# Patient Record
Sex: Female | Born: 1990 | Race: Black or African American | Hispanic: No | Marital: Single | Smoking: Never smoker
Health system: Southern US, Community
[De-identification: ages and names within clinical notes are randomized; demographics above are authoritative.]

## PROBLEM LIST (undated history)

## (undated) DIAGNOSIS — L732 Hidradenitis suppurativa: Secondary | ICD-10-CM

---

## 2016-11-09 HISTORY — PX: ANKLE FRACTURE SURGERY: SHX122

## 2017-07-06 ENCOUNTER — Emergency Department (HOSPITAL_BASED_OUTPATIENT_CLINIC_OR_DEPARTMENT_OTHER)
Admission: EM | Admit: 2017-07-06 | Discharge: 2017-07-06 | Disposition: A | Discharge status: Home / Self Care | Payer: Self-pay | Attending: Emergency Medicine | Admitting: Emergency Medicine

## 2017-07-06 ENCOUNTER — Encounter (HOSPITAL_BASED_OUTPATIENT_CLINIC_OR_DEPARTMENT_OTHER): Payer: Self-pay | Admitting: *Deleted

## 2017-07-06 DIAGNOSIS — L0291 Cutaneous abscess, unspecified: Secondary | ICD-10-CM

## 2017-07-06 DIAGNOSIS — L02412 Cutaneous abscess of left axilla: Secondary | ICD-10-CM | POA: Insufficient documentation

## 2017-07-06 LAB — CBC WITH DIFFERENTIAL/PLATELET
BASOS ABS: 0 10*3/uL (ref 0.0–0.1)
BASOS PCT: 0 %
EOS ABS: 0.3 10*3/uL (ref 0.0–0.7)
EOS PCT: 3 %
HEMATOCRIT: 36 % (ref 36.0–46.0)
Hemoglobin: 11.9 g/dL — ABNORMAL LOW (ref 12.0–15.0)
Lymphocytes Relative: 38 %
Lymphs Abs: 3.6 10*3/uL (ref 0.7–4.0)
MCH: 28.9 pg (ref 26.0–34.0)
MCHC: 33.1 g/dL (ref 30.0–36.0)
MCV: 87.4 fL (ref 78.0–100.0)
MONO ABS: 0.7 10*3/uL (ref 0.1–1.0)
MONOS PCT: 8 %
Neutro Abs: 4.8 10*3/uL (ref 1.7–7.7)
Neutrophils Relative %: 51 %
PLATELETS: 363 10*3/uL (ref 150–400)
RBC: 4.12 MIL/uL (ref 3.87–5.11)
RDW: 13.9 % (ref 11.5–15.5)
WBC: 9.4 10*3/uL (ref 4.0–10.5)

## 2017-07-06 LAB — BASIC METABOLIC PANEL
Anion gap: 6 (ref 5–15)
BUN: 12 mg/dL (ref 6–20)
CO2: 25 mmol/L (ref 22–32)
CREATININE: 0.47 mg/dL (ref 0.44–1.00)
Calcium: 8.9 mg/dL (ref 8.9–10.3)
Chloride: 107 mmol/L (ref 101–111)
Glucose, Bld: 94 mg/dL (ref 65–99)
Potassium: 3.5 mmol/L (ref 3.5–5.1)
SODIUM: 138 mmol/L (ref 135–145)

## 2017-07-06 LAB — I-STAT CG4 LACTIC ACID, ED: Lactic Acid, Venous: 0.54 mmol/L (ref 0.5–1.9)

## 2017-07-06 MED ORDER — ONDANSETRON HCL 4 MG/2ML IJ SOLN
4.0000 mg | Freq: Once | INTRAMUSCULAR | Status: AC
  Administered 2017-07-06: 4 mg via INTRAVENOUS
  Filled 2017-07-06: qty 2

## 2017-07-06 MED ORDER — CLINDAMYCIN HCL 150 MG PO CAPS: 300.0000 mg | ORAL_CAPSULE | Freq: Two times a day (BID) | ORAL | 0 refills | Status: AC

## 2017-07-06 MED ORDER — ACETAMINOPHEN 500 MG PO TABS
1000.0000 mg | ORAL_TABLET | Freq: Once | ORAL | Status: AC
  Administered 2017-07-06: 1000 mg via ORAL
  Filled 2017-07-06: qty 2

## 2017-07-06 MED ORDER — MORPHINE SULFATE (PF) 4 MG/ML IV SOLN
4.0000 mg | Freq: Once | INTRAVENOUS | Status: AC
  Administered 2017-07-06: 4 mg via INTRAVENOUS
  Filled 2017-07-06: qty 1

## 2017-07-06 MED ORDER — CLINDAMYCIN PHOSPHATE 1 % EX GEL: Freq: Two times a day (BID) | CUTANEOUS | 0 refills | Status: DC

## 2017-07-06 NOTE — ED Notes (Signed)
Pt directed to pharmacy to pick up RX. Pt has a ride. Ambulatory to d/c window with steady gait.

## 2017-07-06 NOTE — ED Provider Notes (Signed)
[facility]   CSN: [id number] Arrival date & time: 07/06/17  1043     History   Chief Complaint Chief Complaint  Patient presents with  . Abscess    HPI Marcia Durham is a 26 y.o. female presents to the ED for evaluation of multiple boils to bilateral axilla 2 months. Associated with swelling, redness, tenderness and yellow/green discharge.  Also states that she has smaller and self resolving boils and inguinal regions. Has taken ibuprofen and Tylenol for pain with minimal relief. Aggravated by palpation and movement of her arms. Denies fevers, chills, night sweats, myalgias. Denies previous history of the same. She is adopted and does not know of family medical history.  HPI  History reviewed. No pertinent past medical history.  There are no active problems to display for this patient.   History reviewed. No pertinent surgical history.  OB History    No data available       Home Medications    Prior to Admission medications   Medication Sig Start Date End Date Taking? Authorizing Provider  clindamycin (CLEOCIN) 150 MG capsule Take 2 capsules (300 mg total) by mouth 2 (two) times daily. 07/06/17 07/13/17  Liberty Handy, PA-C  clindamycin (CLINDAGEL) 1 % gel Apply topically 2 (two) times daily. 07/06/17   Liberty Handy, PA-C    Family History No family history on file.  Social History Social History  Substance Use Topics  . Smoking status: Never Smoker  . Smokeless tobacco: Never Used  . Alcohol use No     Allergies   Patient has no known allergies.   Review of Systems Review of Systems  Constitutional: Negative for chills, diaphoresis and fever.  Respiratory: Negative for cough.   Cardiovascular: Negative for chest pain.  Gastrointestinal: Negative for abdominal pain, nausea and vomiting.  Genitourinary: Negative for difficulty urinating and dysuria.  Skin: Positive for color change and wound.       +boils      Physical Exam Updated Vital Signs BP 124/79 (BP Location: Left Arm)   Pulse 83   Temp 98.1 F (36.7 C) (Oral)   Resp 18   Ht 5\' 6"  (1.676 m)   Wt 95.3 kg (210 lb)   LMP 06/20/2017   SpO2 99%   BMI 33.89 kg/m   Physical Exam  Constitutional: She is oriented to person, place, and time. She appears well-developed and well-nourished. No distress.  HENT:  Head: Normocephalic and atraumatic.  Nose: Nose normal.  Mouth/Throat: Oropharynx is clear and moist. No oropharyngeal exudate.  Eyes: Pupils are equal, round, and reactive to light. Conjunctivae and EOM are normal.  Neck: Normal range of motion. Neck supple.  Cardiovascular: Normal rate, regular rhythm, normal heart sounds and intact distal pulses.   No murmur heard. Pulmonary/Chest: Effort normal and breath sounds normal. No respiratory distress. She has no wheezes. She has no rales. She exhibits no tenderness.  Abdominal: Soft. Bowel sounds are normal. She exhibits no distension and no mass. There is no tenderness. There is no rebound and no guarding.  Musculoskeletal: Normal range of motion. She exhibits no deformity.  Lymphadenopathy:    She has no cervical adenopathy.  Neurological: She is alert and oriented to person, place, and time. No sensory deficit.  Skin: Skin is warm and dry. Capillary refill takes less than 2 seconds. There is erythema.  Multiple draining inflamed nodules to bilateral axilla with sinus tracts mild erythema, edema and tenderness localized to axilla without streaking  Skin in inguinal region normal   Psychiatric: She has a normal mood and affect. Her behavior is normal. Judgment and thought content normal.  Nursing note and vitals reviewed.    ED Treatments / Results  Labs (all labs ordered are listed, but only abnormal results are displayed) Labs Reviewed  CBC WITH DIFFERENTIAL/PLATELET - Abnormal; Notable for the following:       Result Value   Hemoglobin 11.9 (*)    All other components  within normal limits  BASIC METABOLIC PANEL  I-STAT CG4 LACTIC ACID, ED  I-STAT CG4 LACTIC ACID, ED    EKG  EKG Interpretation None       Radiology No results found.  Procedures Procedures (including critical care time)  Medications Ordered in ED Medications  morphine 4 MG/ML injection 4 mg (4 mg Intravenous Given 07/06/17 1401)  ondansetron (ZOFRAN) injection 4 mg (4 mg Intravenous Given 07/06/17 1359)  acetaminophen (TYLENOL) tablet 1,000 mg (1,000 mg Oral Given 07/06/17 1326)     Initial Impression / Assessment and Plan / ED Course  I have reviewed the triage vital signs and the nursing notes.  Pertinent labs & imaging results that were available during my care of the patient were reviewed by me and considered in my medical decision making (see chart for details).   26 year old female with no medical history presents to ED for evaluation of multiple abscesses to bilateral axilla with localized erythema, edema and tenderness. Actively draining. Also reports occasional boils to her inguinal region, none today. No indication for formal incision and drainage today given recurrence and multiple nodules in sinus tracts. Offered patient cleansing and pressure to relieve some of the pressure however patient opted to do this herself as she is afraid that it will hurt. No fevers, chills, sweats. Lab work today within normal limits. Lactic acid normal. No leukocytosis. Suspect hidradenitis. We'll discharge with high-dose NSAIDs, oral antibiotics and topical antibiotics. Given contact information for PCP and surgery. Discussed signs and symptoms that would warrant return to the ED.   Final Clinical Impressions(s) / ED Diagnoses   Final diagnoses:  Abscess    New Prescriptions New Prescriptions   CLINDAMYCIN (CLEOCIN) 150 MG CAPSULE    Take 2 capsules (300 mg total) by mouth 2 (two) times daily.   CLINDAMYCIN (CLINDAGEL) 1 % GEL    Apply topically 2 (two) times daily.     Liberty Handy, PA-C 07/06/17 1427    Charlynne Pander, MD 07/06/17 [phone]

## 2017-07-06 NOTE — ED Notes (Signed)
Pt given d/c instructions per PA Gibbons to review. She will be in to talk to pt before sending her home

## 2017-07-06 NOTE — Discharge Instructions (Signed)
You were evaluated in the ED for multiple abscesses to your armpits. Your blood work showed no generalized infection in your body. Given the recurrence of your abscesses in your armpits and your groin I am concerned you may have hidradenitis suppurativa. Please repeat the attached information on this condition. This condition typically requires medicines and sometimes surgery. Please establish care with a primary care physician to help manage this. Additionally call surgery clinic to establish care as this sometimes needs surgical intervention.  Take oral antibiotic as prescribed. Additionally, use topical antibiotic cream to help with flares. Take ibuprofen and tylenol for inflammation and pain. Avoid contaminating this area and picking at your skin with dirty hands.  Return to the ED if you develop worsening swelling, pain, discharge, fevers, chills, body aches.

## 2017-07-06 NOTE — ED Notes (Signed)
Pt has a ride at bedside

## 2017-07-06 NOTE — ED Notes (Signed)
PA at bedside.

## 2017-07-06 NOTE — ED Triage Notes (Signed)
Abscess to both axilla. Draining and pain.

## 2017-11-06 ENCOUNTER — Emergency Department (HOSPITAL_BASED_OUTPATIENT_CLINIC_OR_DEPARTMENT_OTHER)
Admission: EM | Admit: 2017-11-06 | Discharge: 2017-11-06 | Disposition: A | Discharge status: Home / Self Care | Payer: Self-pay | Attending: Emergency Medicine | Admitting: Emergency Medicine

## 2017-11-06 ENCOUNTER — Encounter (HOSPITAL_BASED_OUTPATIENT_CLINIC_OR_DEPARTMENT_OTHER): Payer: Self-pay | Admitting: Emergency Medicine

## 2017-11-06 ENCOUNTER — Other Ambulatory Visit: Payer: Self-pay

## 2017-11-06 DIAGNOSIS — L748 Other eccrine sweat disorders: Secondary | ICD-10-CM

## 2017-11-06 DIAGNOSIS — L02411 Cutaneous abscess of right axilla: Secondary | ICD-10-CM | POA: Insufficient documentation

## 2017-11-06 DIAGNOSIS — L02412 Cutaneous abscess of left axilla: Secondary | ICD-10-CM | POA: Insufficient documentation

## 2017-11-06 MED ORDER — TRAMADOL HCL 50 MG PO TABS: 50.0000 mg | ORAL_TABLET | Freq: Four times a day (QID) | ORAL | 0 refills | Status: AC | PRN

## 2017-11-06 MED ORDER — TRAMADOL HCL 50 MG PO TABS
50.0000 mg | ORAL_TABLET | Freq: Once | ORAL | Status: AC
  Administered 2017-11-06: 50 mg via ORAL
  Filled 2017-11-06: qty 1

## 2017-11-06 MED ORDER — LIDOCAINE HCL 2 % IJ SOLN
20.0000 mL | Freq: Once | INTRAMUSCULAR | Status: AC
  Administered 2017-11-06: 400 mg

## 2017-11-06 MED ORDER — NAPROXEN 500 MG PO TABS: 500.0000 mg | ORAL_TABLET | Freq: Two times a day (BID) | ORAL | 0 refills | Status: AC

## 2017-11-06 MED ORDER — NAPROXEN 250 MG PO TABS
500.0000 mg | ORAL_TABLET | Freq: Once | ORAL | Status: AC
  Administered 2017-11-06: 500 mg via ORAL
  Filled 2017-11-06: qty 2

## 2017-11-06 MED ORDER — LIDOCAINE HCL 2 % IJ SOLN
INTRAMUSCULAR | Status: AC
  Filled 2017-11-06: qty 20

## 2017-11-06 NOTE — ED Notes (Signed)
ED Provider at bedside. 

## 2017-11-06 NOTE — ED Triage Notes (Signed)
Pt c/o abscess to B/L axilla area ongoing "for a while." Pt reports areas are draining brown/yellow colored fluid.

## 2017-11-06 NOTE — Discharge Instructions (Addendum)
You were seen in the emergency department for multiple abscesses in your arm pit area. We have prescribed you Naproxen and tramadol for pain:  - Naproxen is a non steroidal anti-inflammatory medication.  Take this once every 12 hours as needed.  This should help with pain and swelling.  I suggest that you take this with food as it can cause stomach upset, and at worst stomach bleeding.  Do not take other nonsteroidal anti-inflammatory medications while taking naproxen, this includes Advil, Motrin, or Aleve as this will further irritate your stomach.  - Tramadol-This is a controlled substance pain medication.  You may take this every 6 hours as needed for pain.  Do not drive or operate heavy machinery while taking this medication.  Continue to apply warm compresses to your underarm area.  I suspect you to have hidradenitis suppurativa please see the attached handout for further information regarding this condition.  I provided several dermatology offices contact information, please call and try to make an appointment with 1 of these offices within the next 1 week. I have also provided a few primary care provider offices in the area, you may also try to make an appointment with 1 of these offices.  Return to the emergency department for any new or worsening symptoms.  [facility]:   [facility]  3.2 [phone]  Dermatologist  [address] [id number]  [phone]   Dr. Mertha FindersJohn H. Hall Jr, MD  2.6 [phone])  Dermatologist  [address]  [phone]  [facility]  3.5 (3)  [facility]  [address]  [phone]   [facility]  4.0 (4)  Dermatologist  [address]  [phone]) [phone]  Janalyn Harderafeen Stuart MD  3.0 (2)  Dermatologist  [address]  [phone]  Hoyle SauerMccoy Bruce  2.7 (6)  Dermatologist  [address]  [phone]  [place] Amy Y MD  2.0 (1)  Dermatologist  [address]  [phone]  [facility]  5.0 (3)  Doctor  [address]  [phone]

## 2017-11-06 NOTE — ED Provider Notes (Signed)
[facility] EMERGENCY DEPARTMENT Provider Note   CSN: [id number] Arrival date & time: 11/06/17  1314   History   Chief Complaint Chief Complaint  Patient presents with  . Abscess    HPI Marcia Durham is a 26 y.o. female who presents the emergency department for acute on chronic axillary abscesses that have been worse over the past 1 week.  Patient states she has had problems with abscesses in her axillary region for a few years now with intermittent flares.  Patient states that these abscesses seem worse because they are more painful over the past 1 week.  Patient states she has been taking over-the-counter Tylenol and ibuprofen without significant relief.  She also been applying warm compresses.  States she has had active drainage from both axillary regions that is green/yellow in color, at times blood streaked.  No significant alleviating or aggravating factors.  Patient denies fever, chills, numbness, weakness, nausea, or vomiting.  HPI  History reviewed. No pertinent past medical history.  There are no active problems to display for this patient.   Past Surgical History:  Procedure Laterality Date  . ANKLE FRACTURE SURGERY      OB History    No data available       Home Medications    Prior to Admission medications   Medication Sig Start Date End Date Taking? Authorizing Provider  ibuprofen (ADVIL,MOTRIN) 200 MG tablet Take 200 mg by mouth every 6 (six) hours as needed.   Yes [provider]  naproxen (NAPROSYN) 500 MG tablet Take 1 tablet (500 mg total) by mouth 2 (two) times daily. 11/06/17   Nakyla Bracco R, PA-C  traMADol (ULTRAM) 50 MG tablet Take 1 tablet (50 mg total) by mouth every 6 (six) hours as needed. 11/06/17   Shineka Auble, Pleas KochSamantha R, PA-C    Family History History reviewed. No pertinent family history.  Social History Social History   Tobacco Use  . Smoking status: Never Smoker  . Smokeless tobacco: Never Used    Substance Use Topics  . Alcohol use: No  . Drug use: No     Allergies   Patient has no known allergies.   Review of Systems Review of Systems  Constitutional: Negative for chills and fever.  Respiratory: Negative for shortness of breath.   Gastrointestinal: Negative for abdominal distention, abdominal pain, nausea and vomiting.  Skin:       Multiple abscess to bilateral axillary regions  Neurological: Negative for weakness and numbness.     Physical Exam Updated Vital Signs BP 131/90 (BP Location: Left Arm)   Pulse (!) 102   Temp 98.2 F (36.8 C) (Oral)   Resp 20   LMP 10/23/2017   SpO2 98%   Physical Exam  Constitutional: She appears well-developed and well-nourished.  Non-toxic appearance. She appears distressed (mild secondary to pain).  HENT:  Head: Normocephalic and atraumatic.  Eyes: Conjunctivae are normal. Right eye exhibits no discharge. Left eye exhibits no discharge.  Neck: Neck supple.  Cardiovascular: Normal rate and regular rhythm.  No murmur heard. Pulses:      Radial pulses are 2+ on the right side, and 2+ on the left side.  Pulmonary/Chest: Effort normal and breath sounds normal. She has no wheezes. She has no rales.  Abdominal: Soft. She exhibits no distension. There is no tenderness.  Neurological: She is alert.  Clear speech. 5/5 grip strength bilaterally. Sensation grossly intact to bilateral upper extremities.   Skin:  Patient with induration throughout bilateral axillary  regions.  Right axilla with 2 actively draining areas with underlying minimal fluctuance.  Left axilla with 3 actively draining areas with minimal fluctuance. No overlying erythema or warmth.   Psychiatric: She has a normal mood and affect. Her behavior is normal. Thought content normal.  Nursing note and vitals reviewed.   ED Treatments / Results  Labs (all labs ordered are listed, but only abnormal results are displayed) Labs Reviewed - No data to display  EKG  EKG  Interpretation None      Radiology No results found.  Procedures Procedures (including critical care time)  Medications Ordered in ED Medications  lidocaine (XYLOCAINE) 2 % (with pres) injection (not administered)  traMADol (ULTRAM) tablet 50 mg (not administered)  naproxen (NAPROSYN) tablet 500 mg (not administered)  lidocaine (XYLOCAINE) 2 % (with pres) injection 400 mg (400 mg Infiltration Given by Other 11/06/17 1356)    Initial Impression / Assessment and Plan / ED Course  I have reviewed the triage vital signs and the nursing notes.  Pertinent labs & imaging results that were available during my care of the patient were reviewed by me and considered in my medical decision making (see chart for details).  Patient presents with multiple axillary abscess that are chronic and acutely painful. Patient is nontoxic appearing with stable vital signs. On exam she has multiple actively draining abscesses to bilateral axillary regions. No area of fluctuance without active drainage, no areas that appear to require incision and drainage. Patient does not meet the SIRS or Sepsis criteria. Will treat pain with Naproxen and Tramadol, suspect hydradenitis suppurativa, provided multiple dermatology offices for follow up options. I discussed results, treatment plan, need for dermatology follow-up, and return precautions with the patient. Provided opportunity for questions, patient confirmed understanding and is in agreement with plan.   [place] Controlled Substance reporting System queried    Final Clinical Impressions(s) / ED Diagnoses   Final diagnoses:  Multiple sweat gland abscesses    ED Discharge Orders        Ordered    naproxen (NAPROSYN) 500 MG tablet  2 times daily     11/06/17 1409    traMADol (ULTRAM) 50 MG tablet  Every 6 hours PRN     11/06/17 1409       Micala Saltsman, Pleas KochSamantha R, PA-C 11/06/17 1640    Maia PlanLong, Joshua G, MD 11/06/17 2109

## 2018-03-28 ENCOUNTER — Emergency Department (HOSPITAL_BASED_OUTPATIENT_CLINIC_OR_DEPARTMENT_OTHER)
Admission: EM | Admit: 2018-03-28 | Discharge: 2018-03-28 | Disposition: A | Discharge status: Home / Self Care | Payer: Self-pay | Attending: Physician Assistant | Admitting: Physician Assistant

## 2018-03-28 ENCOUNTER — Other Ambulatory Visit: Payer: Self-pay

## 2018-03-28 ENCOUNTER — Encounter (HOSPITAL_BASED_OUTPATIENT_CLINIC_OR_DEPARTMENT_OTHER): Payer: Self-pay | Admitting: *Deleted

## 2018-03-28 ENCOUNTER — Emergency Department (HOSPITAL_BASED_OUTPATIENT_CLINIC_OR_DEPARTMENT_OTHER): Payer: Self-pay

## 2018-03-28 DIAGNOSIS — O209 Hemorrhage in early pregnancy, unspecified: Secondary | ICD-10-CM | POA: Insufficient documentation

## 2018-03-28 DIAGNOSIS — Z3A01 Less than 8 weeks gestation of pregnancy: Secondary | ICD-10-CM | POA: Insufficient documentation

## 2018-03-28 DIAGNOSIS — O9989 Other specified diseases and conditions complicating pregnancy, childbirth and the puerperium: Secondary | ICD-10-CM | POA: Insufficient documentation

## 2018-03-28 DIAGNOSIS — R8271 Bacteriuria: Secondary | ICD-10-CM | POA: Insufficient documentation

## 2018-03-28 DIAGNOSIS — O4690 Antepartum hemorrhage, unspecified, unspecified trimester: Secondary | ICD-10-CM

## 2018-03-28 DIAGNOSIS — O99891 Other specified diseases and conditions complicating pregnancy: Secondary | ICD-10-CM

## 2018-03-28 LAB — CBC WITH DIFFERENTIAL/PLATELET
BASOS PCT: 0 %
Basophils Absolute: 0 10*3/uL (ref 0.0–0.1)
EOS PCT: 2 %
Eosinophils Absolute: 0.3 10*3/uL (ref 0.0–0.7)
HEMATOCRIT: 33 % — AB (ref 36.0–46.0)
HEMOGLOBIN: 11.3 g/dL — AB (ref 12.0–15.0)
LYMPHS PCT: 34 %
Lymphs Abs: 5.1 10*3/uL — ABNORMAL HIGH (ref 0.7–4.0)
MCH: 29 pg (ref 26.0–34.0)
MCHC: 34.2 g/dL (ref 30.0–36.0)
MCV: 84.6 fL (ref 78.0–100.0)
MONOS PCT: 7 %
Monocytes Absolute: 1 10*3/uL (ref 0.1–1.0)
NEUTROS PCT: 57 %
Neutro Abs: 8.5 10*3/uL — ABNORMAL HIGH (ref 1.7–7.7)
Platelets: 494 10*3/uL — ABNORMAL HIGH (ref 150–400)
RBC: 3.9 MIL/uL (ref 3.87–5.11)
RDW: 14.8 % (ref 11.5–15.5)
WBC: 14.9 10*3/uL — ABNORMAL HIGH (ref 4.0–10.5)

## 2018-03-28 LAB — WET PREP, GENITAL
SPERM: NONE SEEN
Trich, Wet Prep: NONE SEEN
Yeast Wet Prep HPF POC: NONE SEEN

## 2018-03-28 LAB — URINALYSIS, ROUTINE W REFLEX MICROSCOPIC
Bilirubin Urine: NEGATIVE
Glucose, UA: NEGATIVE mg/dL
Ketones, ur: NEGATIVE mg/dL
LEUKOCYTES UA: NEGATIVE
Nitrite: NEGATIVE
PROTEIN: NEGATIVE mg/dL
SPECIFIC GRAVITY, URINE: 1.02 (ref 1.005–1.030)
pH: 7.5 (ref 5.0–8.0)

## 2018-03-28 LAB — BASIC METABOLIC PANEL
ANION GAP: 11 (ref 5–15)
BUN: 9 mg/dL (ref 6–20)
CHLORIDE: 105 mmol/L (ref 101–111)
CO2: 21 mmol/L — AB (ref 22–32)
Calcium: 9 mg/dL (ref 8.9–10.3)
Creatinine, Ser: 0.54 mg/dL (ref 0.44–1.00)
GFR calc non Af Amer: >60 mL/min (ref >60)
Glucose, Bld: 88 mg/dL (ref 65–99)
POTASSIUM: 3.7 mmol/L (ref 3.5–5.1)
Sodium: 137 mmol/L (ref 135–145)

## 2018-03-28 LAB — URINALYSIS, MICROSCOPIC (REFLEX)

## 2018-03-28 LAB — HCG, QUANTITATIVE, PREGNANCY: hCG, Beta Chain, Quant, S: 5308 m[IU]/mL — ABNORMAL HIGH (ref <5)

## 2018-03-28 MED ORDER — CEPHALEXIN 500 MG PO CAPS: 500.0000 mg | ORAL_CAPSULE | Freq: Three times a day (TID) | ORAL | 0 refills | Status: AC

## 2018-03-28 NOTE — ED Provider Notes (Signed)
[facility] EMERGENCY DEPARTMENT Provider Note   CSN: [id number] Arrival date & time: 03/28/18  7829     History   Chief Complaint Chief Complaint  Patient presents with  . Vaginal Bleeding    HPI Marcia Durham is a 27 y.o. female.  HPI 27 year old female with no pertinent past medical history presents to the emergency department today for evaluation of vaginal bleeding in the setting of pregnancy.  Patient states that she took a home pregnancy test approximately 3 weeks ago was positive.  Patient thinks that she is approximate 7 or [redacted] weeks pregnant.  For the past 3 days she reports some spotting.  States that last night the bleeding became heavier.  She reports some small clots.  Patient states this is her fourth pregnancy has had no other problems with her prior pregnancies.  Patient denies any associated abdominal pain.  She reports some nausea that she relates to morning sickness.  She denies any urinary symptoms, fevers or chills.  Patient has not been take anything for symptoms.  Nothing makes better or worse.  Pt denies any fever, chill, ha, vision changes, lightheadedness, dizziness, congestion, neck pain, cp, sob, cough, abd pain, n/v/d, urinary symptoms, change in bowel habits, melena, hematochezia, lower extremity paresthesias.   History reviewed. No pertinent past medical history.  There are no active problems to display for this patient.   Past Surgical History:  Procedure Laterality Date  . ANKLE FRACTURE SURGERY       OB History    Gravida  1   Para      Term      Preterm      AB      Living        SAB      TAB      Ectopic      Multiple      Live Births               Home Medications    Prior to Admission medications   Medication Sig Start Date End Date Taking? Authorizing Provider  ibuprofen (ADVIL,MOTRIN) 200 MG tablet Take 200 mg by mouth every 6 (six) hours as needed.   Yes [provider]  naproxen  (NAPROSYN) 500 MG tablet Take 1 tablet (500 mg total) by mouth 2 (two) times daily. 11/06/17   Petrucelli, Samantha R, PA-C  traMADol (ULTRAM) 50 MG tablet Take 1 tablet (50 mg total) by mouth every 6 (six) hours as needed. 11/06/17   Petrucelli, Pleas Koch, PA-C    Family History History reviewed. No pertinent family history.  Social History Social History   Tobacco Use  . Smoking status: Never Smoker  . Smokeless tobacco: Never Used  Substance Use Topics  . Alcohol use: No  . Drug use: No     Allergies   Patient has no known allergies.   Review of Systems Review of Systems  All other systems reviewed and are negative.    Physical Exam Updated Vital Signs BP 136/90 (BP Location: Left Arm)   Pulse 98   Temp 98.3 F (36.8 C) (Oral)   Resp 18   Ht  (1.676 m)   Wt 106.6 kg (235 lb)   LMP 02/01/2018 (Exact Date)   SpO2 100%   BMI 37.93 kg/m   Physical Exam  Constitutional: She is oriented to person, place, and time. She appears well-developed and well-nourished.  Non-toxic appearance. No distress.  HENT:  Head: Normocephalic and atraumatic.  Nose: Nose normal.  Mouth/Throat: Oropharynx is clear and moist.  Eyes: Pupils are equal, round, and reactive to light. Conjunctivae are normal. Right eye exhibits no discharge. Left eye exhibits no discharge. No scleral icterus.  Neck: Normal range of motion. Neck supple.  Cardiovascular: Normal rate, regular rhythm, normal heart sounds and intact distal pulses. Exam reveals no gallop and no friction rub.  No murmur heard. Pulmonary/Chest: Effort normal and breath sounds normal. No stridor. No respiratory distress. She has no wheezes. She has no rales. She exhibits no tenderness.  Abdominal: Soft. Bowel sounds are normal. There is no tenderness. There is no rigidity, no rebound, no guarding, no CVA tenderness, no tenderness at McBurney's point and negative Murphy's sign.  Genitourinary:  Genitourinary Comments: Chaperone  present for exam. No external lesions, swelling, erythema, or rash of the labia. No erythema, discharge or lesions noted in the vaginal vault.  Small amount of blood noted in vaginal vault.  Cervical loss is closed.  No clots noted.  No CMT tenderness, bleeding or friability. No adnexal tenderness, mass or fullness bilaterally. No inguinal adenopathy or hernia.    Musculoskeletal: Normal range of motion. She exhibits no tenderness.  Lymphadenopathy:    She has no cervical adenopathy.  Neurological: She is alert and oriented to person, place, and time.  Skin: Skin is warm and dry. Capillary refill takes less than 2 seconds. No pallor.  Psychiatric: Her behavior is normal. Judgment and thought content normal.  Nursing note and vitals reviewed.    ED Treatments / Results  Labs (all labs ordered are listed, but only abnormal results are displayed) Labs Reviewed  WET PREP, GENITAL - Abnormal; Notable for the following components:      Result Value   Clue Cells Wet Prep HPF POC PRESENT (*)    WBC, Wet Prep HPF POC MANY (*)    All other components within normal limits  HCG, QUANTITATIVE, PREGNANCY - Abnormal; Notable for the following components:   hCG, Beta Chain, Quant, S 5,308 (*)    All other components within normal limits  BASIC METABOLIC PANEL - Abnormal; Notable for the following components:   CO2 21 (*)    All other components within normal limits  CBC WITH DIFFERENTIAL/PLATELET - Abnormal; Notable for the following components:   WBC 14.9 (*)    Hemoglobin 11.3 (*)    HCT 33.0 (*)    Platelets 494 (*)    Neutro Abs 8.5 (*)    Lymphs Abs 5.1 (*)    All other components within normal limits  URINALYSIS, ROUTINE W REFLEX MICROSCOPIC - Abnormal; Notable for the following components:   Hgb urine dipstick LARGE (*)    All other components within normal limits  URINALYSIS, MICROSCOPIC (REFLEX) - Abnormal; Notable for the following components:   Bacteria, UA MANY (*)    All other  components within normal limits  GC/CHLAMYDIA PROBE AMP ([place]) NOT AT [facility]    EKG None  Radiology US Ob Comp < 14 Wks  Result Date: 03/28/2018 CLINICAL DATA:  Bleeding and spotting for 3 days EXAM: OBSTETRIC <14 WK [place] AND TRANSVAGINAL OB US TECHNIQUE: Both transabdominal and transvaginal ultrasound examinations were performed for complete evaluation of the gestation as well as the maternal uterus, adnexal regions, and pelvic cul-de-sac. Transvaginal technique was performed to assess early pregnancy. COMPARISON:  None. FINDINGS: Intrauterine gestational sac: Single Yolk sac:  Visualized. Embryo:  Visualized. Cardiac Activity: Visualized. Heart Rate: 108 bpm MSD:   mm  w     d CRL:  3 mm mm   5 w   6 d                  [place] [facility]: November 22, 2018 Subchorionic hemorrhage:  None visualized. Maternal uterus/adnexae: Small fibroid in the uterus. The ovaries are unremarkable. IMPRESSION: 1. Single live IUP.  No cause for bleeding identified. Electronically Signed   By: Gerome Sam III M.D   On: 03/28/2018 12:53   US Ob Transvaginal  Result Date: 03/28/2018 CLINICAL DATA:  Bleeding and spotting for 3 days EXAM: OBSTETRIC <14 WK [place] AND TRANSVAGINAL OB US TECHNIQUE: Both transabdominal and transvaginal ultrasound examinations were performed for complete evaluation of the gestation as well as the maternal uterus, adnexal regions, and pelvic cul-de-sac. Transvaginal technique was performed to assess early pregnancy. COMPARISON:  None. FINDINGS: Intrauterine gestational sac: Single Yolk sac:  Visualized. Embryo:  Visualized. Cardiac Activity: Visualized. Heart Rate: 108 bpm MSD:   mm    w     d CRL:  3 mm mm   5 w   6 d                  [place] EDC: November 22, 2018 Subchorionic hemorrhage:  None visualized. Maternal uterus/adnexae: Small fibroid in the uterus. The ovaries are unremarkable. IMPRESSION: 1. Single live IUP.  No cause for bleeding identified. Electronically Signed   By: Gerome Sam III M.D    On: 03/28/2018 12:53    Procedures Procedures (including critical care time)  Medications Ordered in ED Medications - No data to display   Initial Impression / Assessment and Plan / ED Course  I have reviewed the triage vital signs and the nursing notes.  Pertinent labs & imaging results that were available during my care of the patient were reviewed by me and considered in my medical decision making (see chart for details).     Patient presents to the ED for evaluation of nonpainful vaginal bleeding in the setting of pregnancy.  Patient reports positive home pregnancy test and is approximately 7 to [redacted] weeks pregnant.  She has not seen a OB/GYN doctor for her current pregnancy.  This is her fourth pregnancy.  She denies any associated abdominal pain or pelvic pain.  She reports nausea consistent with morning sickness.  Symptoms.  Patient overall well-appearing and nontoxic.  Vital signs are very reassuring.  Lab work shows a mild leukocytosis of 14,000.  Hemoglobin appears at patient's baseline.  No significant electrolyte derangement.  Beta hCG is 03/12/2007.  UA shows many bacteria and squamous epithelial cells.  Small amount of WBCs.  Wet prep was positive for clue cells and WBCs.  Gonorrhea and Chlamydia cultures are pending.  I did review patient's prior lab work and she is Rh+ with her last pregnancy last year.  This was reviewed in patient's care everywhere.  Ultrasound was obtained that shows single live intrauterine pregnancy.  Approximately 5 weeks.  No hemorrhage noted.  Given patient's asymptomatic bacteriuria during pregnancy will place patient on antibiotics.  She does have clue cells on her wet prep however patient denied any vaginal discharge.  This is asymptomatic.  According to guidelines patient does not need to be treated with Flagyl at this time given the fetal risk with Flagyl.  This will need to be discussed with her OB/GYN doctor this week with follow-up.  Encouraged  her to continue taking her prenatal vitamins.  Patient will need close outpatient  follow-up.  Instructed patient on vaginal bleeding during the first trimester with high risk of threatened miscarriage.  She remains hemodynamically stable and appropriate for discharge at this time.  Pt is hemodynamically stable, in NAD, & able to ambulate in the ED. Evaluation does not show pathology that would require ongoing emergent intervention or inpatient treatment. I explained the diagnosis to the patient. Pain has been managed & has no complaints prior to dc. Pt is comfortable with above plan and is stable for discharge at this time. All questions were answered prior to disposition. Strict return precautions for f/u to the ED were discussed. Encouraged follow up with PCP.   Final Clinical Impressions(s) / ED Diagnoses   Final diagnoses:  Vaginal bleeding in pregnancy  Asymptomatic bacteriuria during pregnancy    ED Discharge Orders        Ordered    cephALEXin (KEFLEX) 500 MG capsule  3 times daily     03/28/18 1439       Wallace Keller 03/28/18 2009    Abelino Derrick, MD 03/29/18 [phone]

## 2018-03-28 NOTE — ED Triage Notes (Signed)
Pt recently found out she was pregnant. She thinks she is 7 or 8 weeks. She has been spotting for 3 days.

## 2018-03-28 NOTE — Discharge Instructions (Addendum)
Please start taking prenatal vitamins.  Have discussed your findings with you.  Started antibiotics for the bacteria in your urine.  Your pelvic exam also reveals some bacterial vaginosis however since you are having a discharge will not treat at this time this will need to be discussed with your OB/GYN doctor this week.  Very important for you to follow with an OB/GYN doctor this week.  If you develop worsening pain, worsening bleeding return the ED immediately.

## 2018-03-29 LAB — GC/CHLAMYDIA PROBE AMP (~~LOC~~) NOT AT ARMC
Chlamydia: NEGATIVE
NEISSERIA GONORRHEA: NEGATIVE

## 2018-05-29 IMAGING — US US OB TRANSVAGINAL
1 series · 14 of 28 positions shown · non-contrast
Comparison: None.

CLINICAL DATA: Bleeding and spotting for 3 days

EXAM:
OBSTETRIC <14 WK US AND TRANSVAGINAL OB US
TECHNIQUE: Both transabdominal and transvaginal ultrasound examinations were
performed for complete evaluation of the gestation as well as the
maternal uterus, adnexal regions, and pelvic cul-de-sac.
Transvaginal technique was performed to assess early pregnancy.

[Series 1: us ob transvaginal · 0.18mm/px · 14 of 111 slices shown]
[im 5/111]
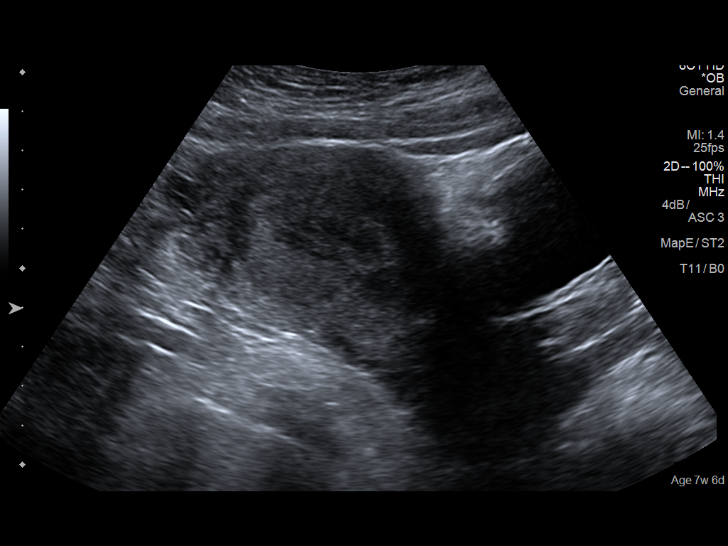
[im 13/111]
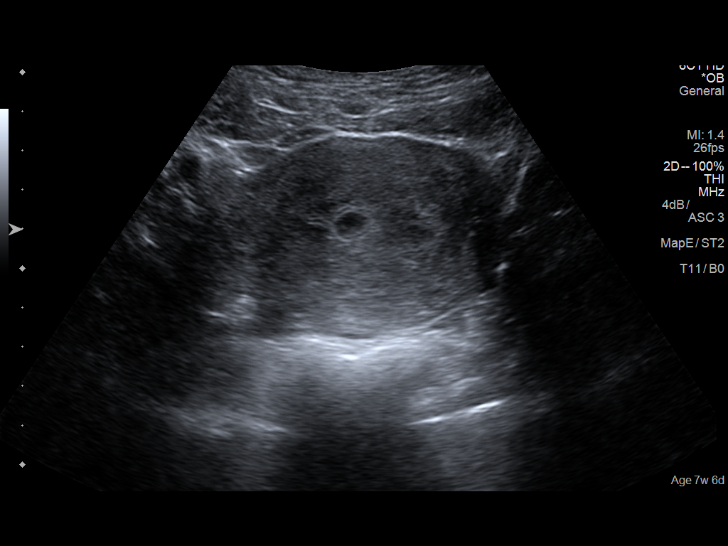
[im 21/111]
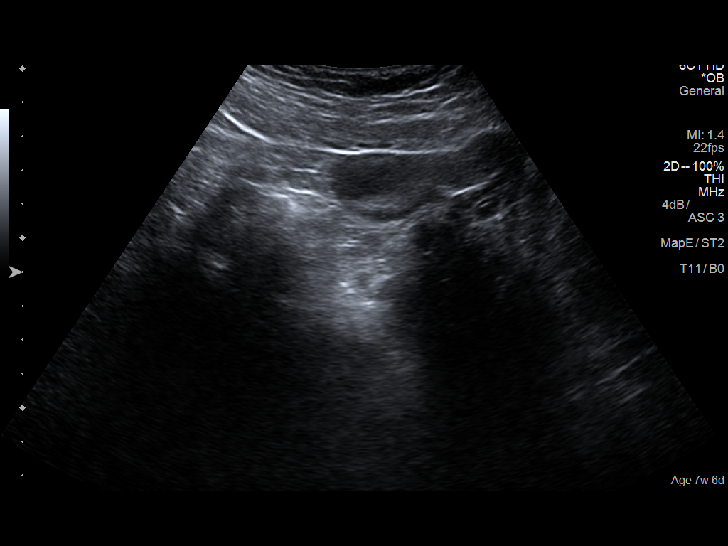
[im 29/111]
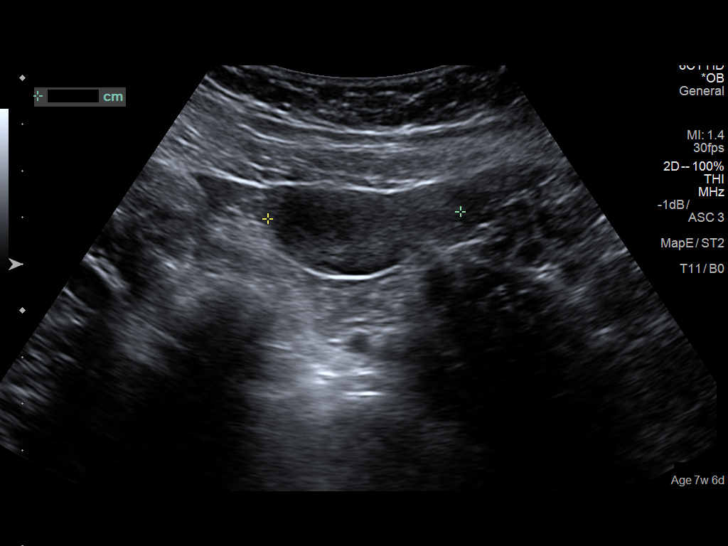
[im 37/111]
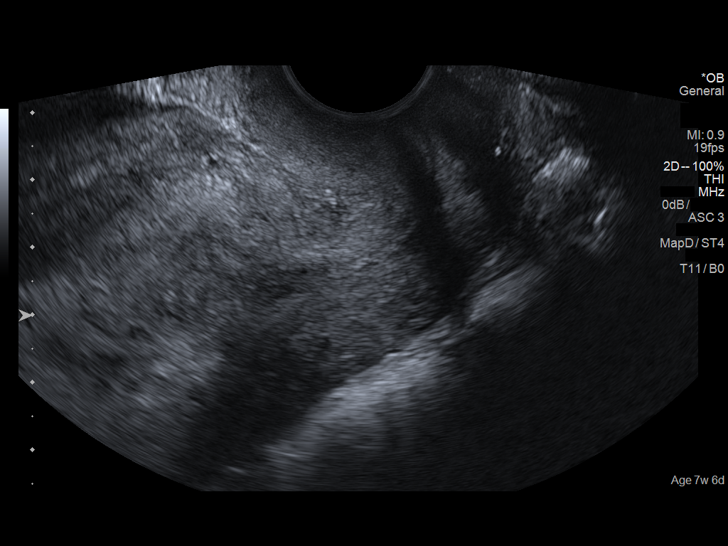
[im 45/111]
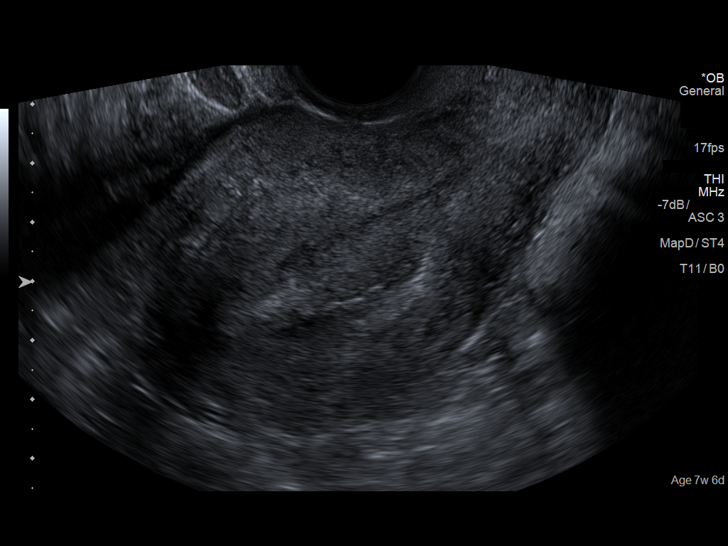
[im 53/111]
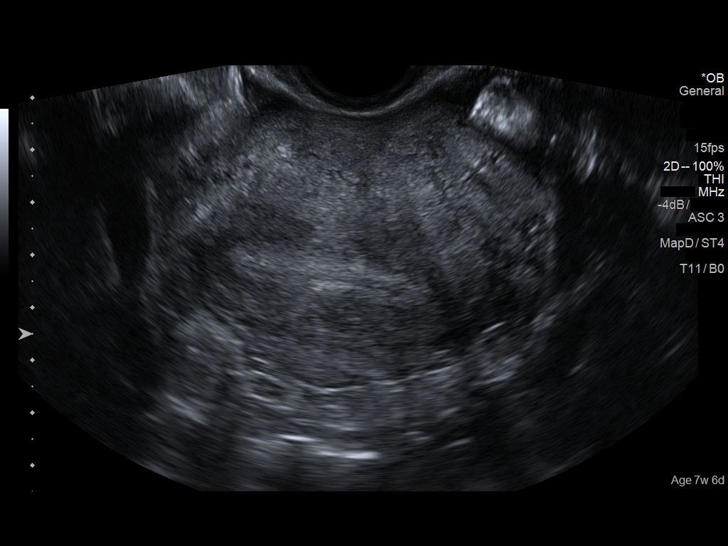
[im 62/111]
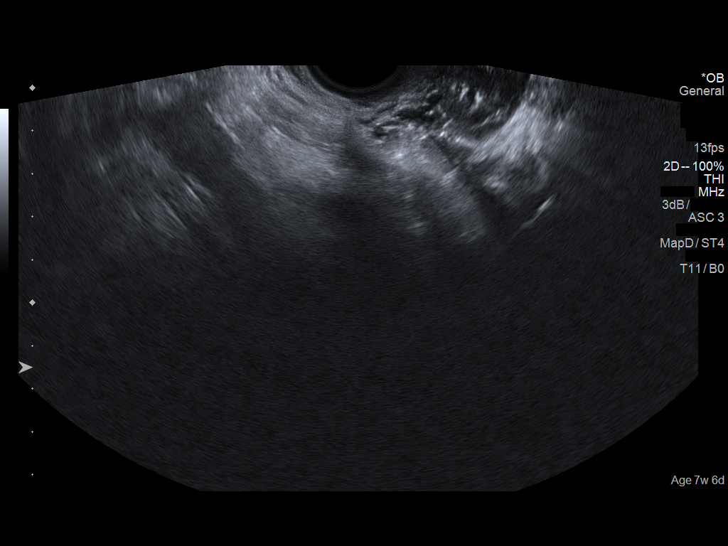
[im 70/111]
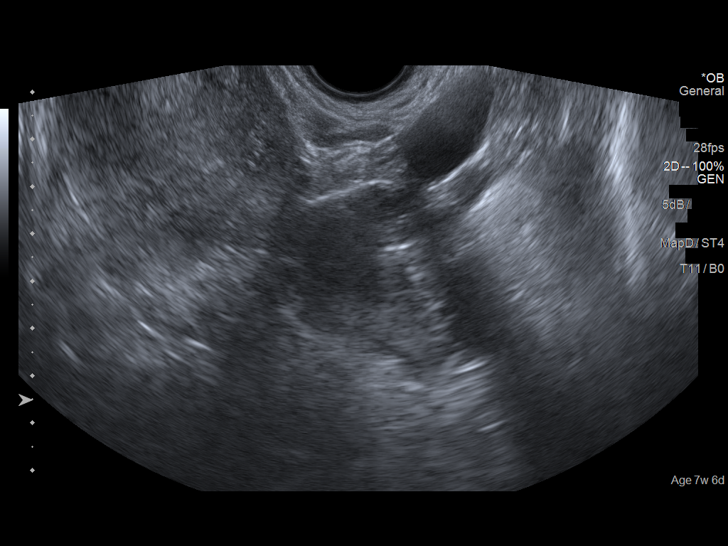
[im 78/111]
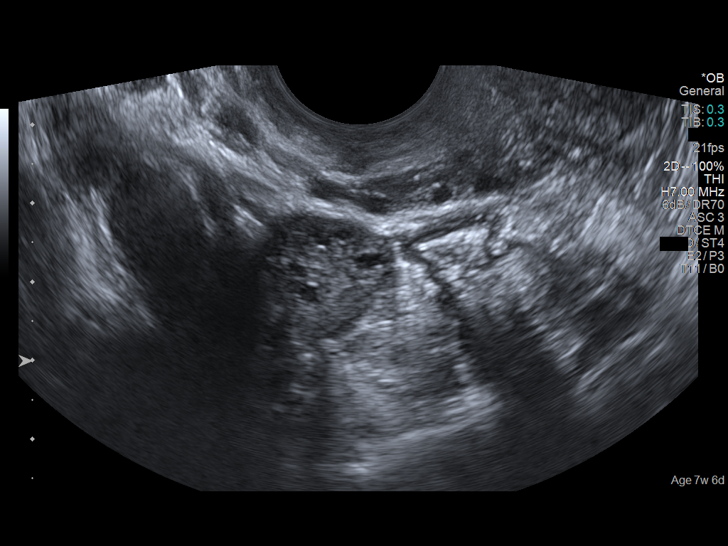
[im 86/111]
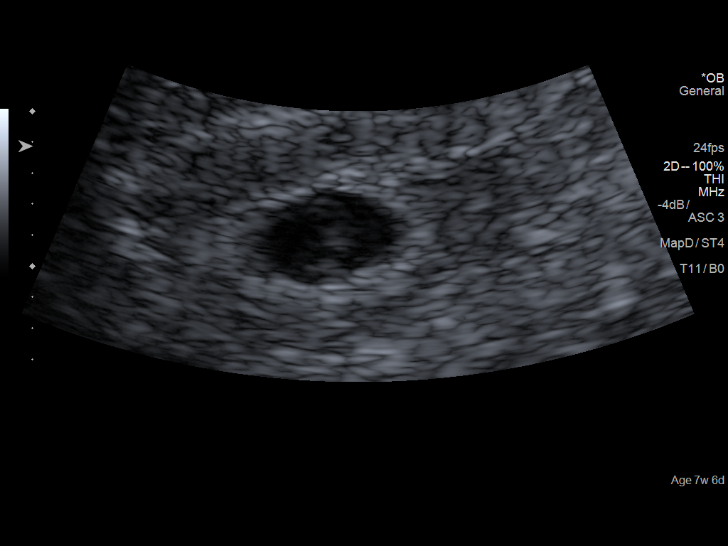
[im 94/111]
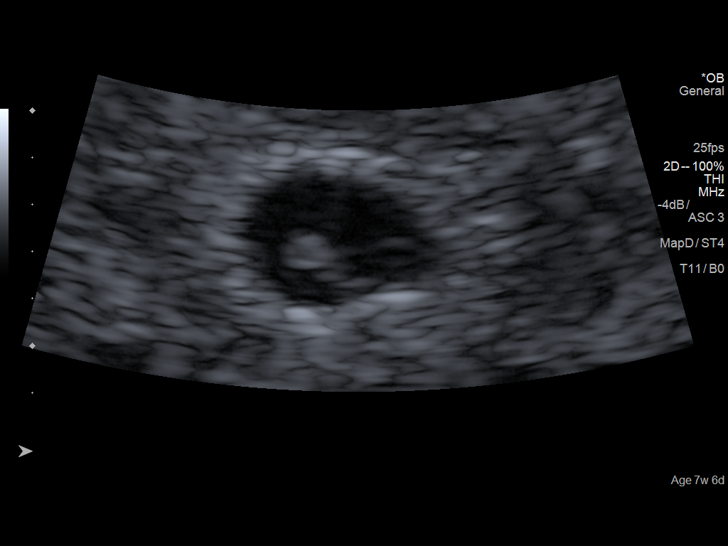
[im 102/111]
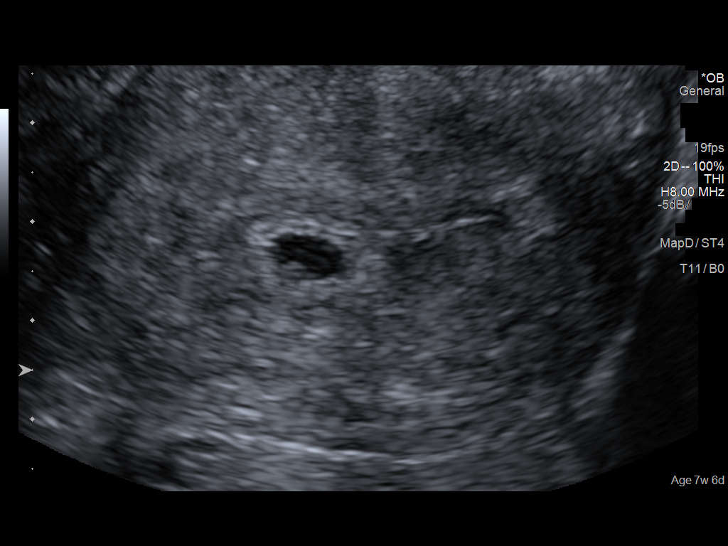
[im 111/111]
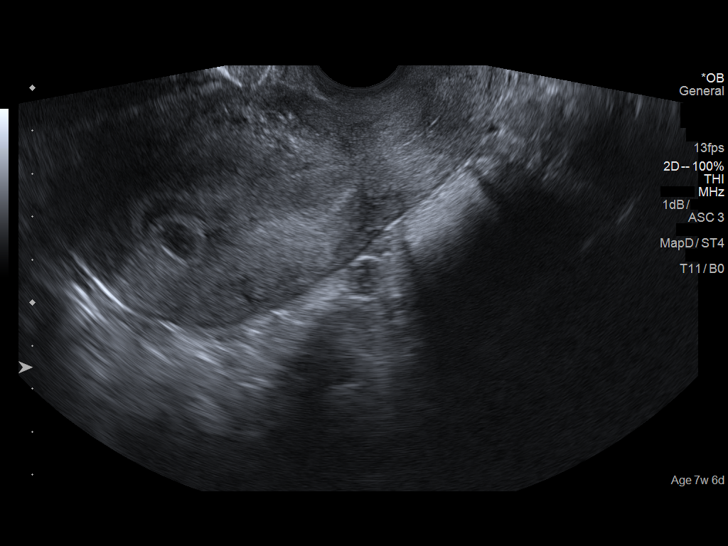

[14 of 28 positions shown; findings below may reference images not displayed]

FINDINGS: Intrauterine gestational sac: Single

Yolk sac:  Visualized.

Embryo:  Visualized.

Cardiac Activity: Visualized.

Heart Rate: 108 bpm

MSD:   mm    w     d

CRL:  3 mm mm   5 w   6 d                  US EDC: November 22, 2018

Subchorionic hemorrhage:  None visualized.

Maternal uterus/adnexae: Small fibroid in the uterus. The ovaries
are unremarkable.
IMPRESSION: 1. Single live IUP.  No cause for bleeding identified.

## 2024-01-26 ENCOUNTER — Other Ambulatory Visit: Payer: Self-pay

## 2024-01-26 ENCOUNTER — Emergency Department (HOSPITAL_BASED_OUTPATIENT_CLINIC_OR_DEPARTMENT_OTHER)
Admission: EM | Admit: 2024-01-26 | Discharge: 2024-01-27 | Disposition: A | Discharge status: Home / Self Care | Attending: Emergency Medicine | Admitting: Emergency Medicine

## 2024-01-26 ENCOUNTER — Emergency Department (HOSPITAL_BASED_OUTPATIENT_CLINIC_OR_DEPARTMENT_OTHER)

## 2024-01-26 ENCOUNTER — Encounter (HOSPITAL_BASED_OUTPATIENT_CLINIC_OR_DEPARTMENT_OTHER): Payer: Self-pay | Admitting: Emergency Medicine

## 2024-01-26 DIAGNOSIS — M546 Pain in thoracic spine: Secondary | ICD-10-CM | POA: Diagnosis present

## 2024-01-26 DIAGNOSIS — R030 Elevated blood-pressure reading, without diagnosis of hypertension: Secondary | ICD-10-CM | POA: Diagnosis not present

## 2024-01-26 DIAGNOSIS — M7918 Myalgia, other site: Secondary | ICD-10-CM

## 2024-01-26 DIAGNOSIS — R0789 Other chest pain: Secondary | ICD-10-CM | POA: Diagnosis not present

## 2024-01-26 HISTORY — DX: Hidradenitis suppurativa: L73.2

## 2024-01-26 MED ORDER — METHOCARBAMOL 500 MG PO TABS: 500.0000 mg | ORAL_TABLET | Freq: Two times a day (BID) | ORAL | 0 refills | Status: AC

## 2024-01-26 MED ORDER — IBUPROFEN 600 MG PO TABS: 600.0000 mg | ORAL_TABLET | Freq: Four times a day (QID) | ORAL | 0 refills | Status: AC | PRN

## 2024-01-26 MED ORDER — KETOROLAC TROMETHAMINE 30 MG/ML IJ SOLN
30.0000 mg | Freq: Once | INTRAMUSCULAR | Status: AC
Start: 2024-01-26 — End: 2024-01-26
  Administered 2024-01-26: 30 mg via INTRAMUSCULAR
  Filled 2024-01-26: qty 1

## 2024-01-26 MED ORDER — OXYCODONE HCL 5 MG PO TABS
5.0000 mg | ORAL_TABLET | Freq: Once | ORAL | Status: AC
  Administered 2024-01-26: 5 mg via ORAL
  Filled 2024-01-26: qty 1

## 2024-01-26 NOTE — ED Triage Notes (Signed)
 Upper back pain that radiates down BL arms. Also endorses numbness in BL hands. Onset last night. Pain is constant. Worse with movement. Denies injury/trauma.

## 2024-01-26 NOTE — ED Provider Notes (Signed)
 [place] [facility]ERGENCY DEPARTMENT AT [facility] Provider Note   CSN: [id number] Arrival date & time: 01/26/24  1736     History  Chief Complaint  Patient presents with   Back Pain    Marcia Durham is a 33 y.o. female.  Patient is a 33 year old female presenting with back pain.  Patient admits to upper back pain and chest pain that is reproducible by touch and worse with deep breathing.  She denies any fevers or coughing.  Denies any shortness of breath.  Denies any rashes or skin lesions.  She denies any falls or history of trauma.  Denies any heavy lifting or excessive use of muscles.  The history is provided by the patient. No language interpreter was used.  Back Pain Associated symptoms: chest pain   Associated symptoms: no abdominal pain, no dysuria and no fever        Home Medications Prior to Admission medications   Medication Sig Start Date End Date Taking? Authorizing Provider  ibuprofen (ADVIL) 600 MG tablet Take 1 tablet (600 mg total) by mouth every 6 (six) hours as needed. 01/26/24  Yes Edwin Dada P, DO  methocarbamol (ROBAXIN) 500 MG tablet Take 1 tablet (500 mg total) by mouth 2 (two) times daily. 01/26/24  Yes Edwin Dada P, DO  cephALEXin (KEFLEX) 500 MG capsule Take 1 capsule (500 mg total) by mouth 3 (three) times daily. 03/28/18   Rise Mu, PA-C  naproxen (NAPROSYN) 500 MG tablet Take 1 tablet (500 mg total) by mouth 2 (two) times daily. 11/06/17   Petrucelli, Samantha R, PA-C  traMADol (ULTRAM) 50 MG tablet Take 1 tablet (50 mg total) by mouth every 6 (six) hours as needed. 11/06/17   Petrucelli, Pleas Koch, PA-C      Allergies    Hydrocodone-acetaminophen    Review of Systems   Review of Systems  Constitutional:  Negative for chills and fever.  HENT:  Negative for ear pain and sore throat.   Eyes:  Negative for pain and visual disturbance.  Respiratory:  Negative for cough and shortness of breath.   Cardiovascular:  Positive  for chest pain. Negative for palpitations.  Gastrointestinal:  Negative for abdominal pain and vomiting.  Genitourinary:  Negative for dysuria and hematuria.  Musculoskeletal:  Positive for back pain. Negative for arthralgias.  Skin:  Negative for color change and rash.  Neurological:  Negative for seizures and syncope.  All other systems reviewed and are negative.   Physical Exam Updated Vital Signs BP (!) 163/99   Pulse 80   Temp 98.8 F (37.1 C) (Oral)   Resp 20   Ht 5\' 6"  (1.676 m)   Wt 99.8 kg   LMP 01/12/2024   SpO2 100%   BMI 35.51 kg/m  Physical Exam Vitals and nursing note reviewed.  Constitutional:      General: She is not in acute distress.    Appearance: She is well-developed.  HENT:     Head: Normocephalic and atraumatic.  Eyes:     Conjunctiva/sclera: Conjunctivae normal.  Cardiovascular:     Rate and Rhythm: Normal rate and regular rhythm.     Heart sounds: No murmur heard. Pulmonary:     Effort: Pulmonary effort is normal. No respiratory distress.     Breath sounds: Normal breath sounds.  Chest:     Chest wall: Tenderness present. No mass, deformity or swelling.    Abdominal:     Palpations: Abdomen is soft.     Tenderness:  There is no abdominal tenderness.  Musculoskeletal:        General: No swelling.     Cervical back: Neck supple.     Thoracic back: Tenderness present. No bony tenderness.       Back:  Skin:    General: Skin is warm and dry.     Capillary Refill: Capillary refill takes less than 2 seconds.     Findings: No rash.  Neurological:     Mental Status: She is alert.  Psychiatric:        Mood and Affect: Mood normal.     ED Results / Procedures / Treatments   Labs (all labs ordered are listed, but only abnormal results are displayed) Labs Reviewed - No data to display  EKG EKG Interpretation Date/Time:  Wednesday January 26 2024 23:39:42 EDT Ventricular Rate:  73 PR Interval:  187 QRS Duration:  94 QT  Interval:  367 QTC Calculation: 405 R Axis:   40  Text Interpretation: Sinus rhythm Confirmed by Kennis Carina [phone]) on 01/26/2024 11:44:10 PM  Radiology DG Chest Portable 1 View Result Date: 01/26/2024 CLINICAL DATA:  Chest pain. EXAM: PORTABLE CHEST 1 VIEW COMPARISON:  None Available. FINDINGS: Lung volumes are low.The cardiomediastinal contours are normal. The lungs are clear. Pulmonary vasculature is normal. No consolidation, pleural effusion, or pneumothorax. No acute osseous abnormalities are seen. IMPRESSION: Low lung volumes without acute finding. Electronically Signed   By: Narda Rutherford M.D.   On: 01/26/2024 22:33    Procedures Procedures    Medications Ordered in ED Medications  ketorolac (TORADOL) 30 MG/ML injection 30 mg (30 mg Intramuscular Given 01/26/24 2249)  oxyCODONE (Oxy IR/ROXICODONE) immediate release tablet 5 mg (5 mg Oral Given 01/26/24 2248)    ED Course/ Medical Decision Making/ A&P                                 Medical Decision Making Amount and/or Complexity of Data Reviewed Radiology: ordered.  Risk Prescription drug management.   33 year old female presenting for reproducible chest wall tenderness and paraspinal muscle tenderness in the back.  On exam patient is alert and oriented x 3, no acute distress, afebrile, stable vital signs.  Physical exam demonstrates reproducible tenderness.  Likely costochondritis.  Patient also has reproducible thoracic lumbar paraspinal muscle tenderness.  No spasms.  No midline spinal tenderness.  No masses, deformities, swelling, or rashes.  ECG is stable without arrhythmias.  Chest x-ray demonstrates no acute process.  Symptoms are likely musculoskeletal in nature.  Negative.  Doubt PE.  No signs or symptoms of infection or sepsis.  On reevaluation patient had improvement of symptoms after pain management.  Recommended for rest and close follow-up with PCP if symptoms do not improve.  Patient in no distress and  overall condition improved here in the ED. Detailed discussions were had with the patient regarding current findings, and need for close f/u with PCP or on call doctor. The patient has been instructed to return immediately if the symptoms worsen in any way for re-evaluation. Patient verbalized understanding and is in agreement with current care plan. All questions answered prior to discharge.         Final Clinical Impression(s) / ED Diagnoses Final diagnoses:  Musculoskeletal pain  Elevated blood pressure reading    Rx / DC Orders ED Discharge Orders          Ordered    methocarbamol (ROBAXIN) 500  MG tablet  2 times daily        01/26/24 2359    ibuprofen (ADVIL) 600 MG tablet  Every 6 hours PRN        01/26/24 2359              Edwin Dada P, DO 01/27/24 0000

## 2024-01-26 NOTE — Discharge Instructions (Addendum)
 In addition to your muscular pain, your blood pressure was elevated throughout your visit.  We have you at 163/99.  This may be secondary due to pain.  I recommend that you have a follow-up in the next 2 weeks to have your blood pressure rechecked to see if it has gone down once your muscular pain has improved.

## 2024-01-26 NOTE — ED Notes (Signed)
 Pt. Reports her back is hurting and she is wanting pain meds.  Pt. States she has numbness in hands bilat.  And pain in both arms.    Pt. Did drive herself here to the hospital.
